# Patient Record
Sex: Male | Born: 1960 | Race: White | Hispanic: No
Health system: Southern US, Community
[De-identification: ages and names within clinical notes are randomized; demographics above are authoritative.]

## PROBLEM LIST (undated history)

## (undated) HISTORY — PX: INNER EAR SURGERY: SHX679

---

## 2014-11-21 ENCOUNTER — Emergency Department (HOSPITAL_BASED_OUTPATIENT_CLINIC_OR_DEPARTMENT_OTHER)
Admission: EM | Admit: 2014-11-21 | Discharge: 2014-11-21 | Disposition: A | Discharge status: Home / Self Care | Payer: Worker's Compensation | Attending: Emergency Medicine | Admitting: Emergency Medicine

## 2014-11-21 ENCOUNTER — Emergency Department (HOSPITAL_BASED_OUTPATIENT_CLINIC_OR_DEPARTMENT_OTHER): Payer: Worker's Compensation

## 2014-11-21 ENCOUNTER — Encounter (HOSPITAL_BASED_OUTPATIENT_CLINIC_OR_DEPARTMENT_OTHER): Payer: Self-pay

## 2014-11-21 DIAGNOSIS — Z720 Tobacco use: Secondary | ICD-10-CM | POA: Insufficient documentation

## 2014-11-21 DIAGNOSIS — Y9389 Activity, other specified: Secondary | ICD-10-CM | POA: Diagnosis not present

## 2014-11-21 DIAGNOSIS — S3992XA Unspecified injury of lower back, initial encounter: Secondary | ICD-10-CM | POA: Diagnosis present

## 2014-11-21 DIAGNOSIS — M545 Low back pain, unspecified: Secondary | ICD-10-CM

## 2014-11-21 DIAGNOSIS — Y990 Civilian activity done for income or pay: Secondary | ICD-10-CM | POA: Diagnosis not present

## 2014-11-21 DIAGNOSIS — X58XXXA Exposure to other specified factors, initial encounter: Secondary | ICD-10-CM | POA: Insufficient documentation

## 2014-11-21 DIAGNOSIS — Y9289 Other specified places as the place of occurrence of the external cause: Secondary | ICD-10-CM | POA: Insufficient documentation

## 2014-11-21 DIAGNOSIS — S32020A Wedge compression fracture of second lumbar vertebra, initial encounter for closed fracture: Secondary | ICD-10-CM | POA: Insufficient documentation

## 2014-11-21 DIAGNOSIS — S32000A Wedge compression fracture of unspecified lumbar vertebra, initial encounter for closed fracture: Secondary | ICD-10-CM

## 2014-11-21 MED ORDER — NAPROXEN 500 MG PO TABS: 500.0000 mg | ORAL_TABLET | Freq: Two times a day (BID) | ORAL | Status: AC

## 2014-11-21 MED ORDER — HYDROCODONE-ACETAMINOPHEN 5-325 MG PO TABS: 1.0000 | ORAL_TABLET | Freq: Four times a day (QID) | ORAL | Status: AC | PRN

## 2014-11-21 MED ORDER — CYCLOBENZAPRINE HCL 10 MG PO TABS: 10.0000 mg | ORAL_TABLET | Freq: Two times a day (BID) | ORAL | Status: AC | PRN

## 2014-11-21 NOTE — Discharge Instructions (Signed)
Work note provided to be out of work for a week. X-rays did show evidence of an upper lumbar compression fracture that may be responsible for the pain that you have in your left anterior thigh. It would not explain the lower back pain as well. Lower back pain as well. Take medications as directed. Take the Naprosyn on a regular basis. Rest as much possible for the next week. Make an appointment to follow-up with orthopedics. Workmen's Comp. paperwork completed.

## 2014-11-21 NOTE — ED Provider Notes (Signed)
CSN: [id number]     Arrival date & time 11/21/14  4098 History   First MD Initiated Contact with Patient 11/21/14 [phone]     Chief Complaint  Patient presents with  . Back Pain     (Consider location/radiation/quality/duration/timing/severity/associated sxs/prior Treatment) Patient is a 54 y.o. male presenting with back pain. The history is provided by the patient.  Back Pain Associated symptoms: no abdominal pain, no chest pain, no dysuria, no fever, no numbness and no weakness    the patient injured the back lifting a trailer on September 19. Originally pain was low right-sided back and radiating into the right groin. Now he has bilateral low back pain goes down into the buttocks no posterior leg pain. However does have discomfort to the front of the left leg. Pain is rated 6 out of 10. Currently now left side hurts more than right. No upper back pain. No numbness or weakness to the legs. No incontinence. Pain is worse with movement.  History reviewed. No pertinent past medical history. Past Surgical History  Procedure Laterality Date  . Inner ear surgery     No family history on file. Social History  Substance Use Topics  . Smoking status: Current Every Day Smoker -- 1.50 packs/day    Types: Cigarettes  . Smokeless tobacco: None  . Alcohol Use: Yes     Comment: occassionaly     Review of Systems  Constitutional: Negative for fever.  HENT: Negative for congestion.   Eyes: Negative for visual disturbance.  Respiratory: Negative for shortness of breath.   Cardiovascular: Negative for chest pain.  Gastrointestinal: Negative for nausea, vomiting and abdominal pain.  Genitourinary: Negative for dysuria and hematuria.  Musculoskeletal: Positive for back pain. Negative for neck pain.  Skin: Negative for rash.  Neurological: Negative for weakness and numbness.  Hematological: Does not bruise/bleed easily.  Psychiatric/Behavioral: Negative for confusion.      Allergies  Review  of patient's allergies indicates no known allergies.  Home Medications   Prior to Admission medications   Medication Sig Start Date End Date Taking? Authorizing Provider  cyclobenzaprine (FLEXERIL) 10 MG tablet Take 1 tablet (10 mg total) by mouth 2 (two) times daily as needed for muscle spasms. 11/21/14   Vanetta Mulders, MD  HYDROcodone-acetaminophen (NORCO/VICODIN) 5-325 MG tablet Take 1-2 tablets by mouth every 6 (six) hours as needed for moderate pain. 11/21/14   Vanetta Mulders, MD  naproxen (NAPROSYN) 500 MG tablet Take 1 tablet (500 mg total) by mouth 2 (two) times daily. 11/21/14   Vanetta Mulders, MD   BP 132/69 mmHg  Pulse 75  Temp(Src) 98.1 F (36.7 C) (Oral)  Resp 17  SpO2 100% Physical Exam  Constitutional: He is oriented to person, place, and time. He appears well-developed and well-nourished. No distress.  HENT:  Head: Normocephalic and atraumatic.  Mouth/Throat: Oropharynx is clear and moist.  Eyes: Conjunctivae and EOM are normal. Pupils are equal, round, and reactive to light.  Neck: Normal range of motion. Neck supple.  Cardiovascular: Normal rate, regular rhythm and normal heart sounds.   No murmur heard. Pulmonary/Chest: Effort normal and breath sounds normal. No respiratory distress.  Abdominal: Soft. Bowel sounds are normal. There is no tenderness.  Musculoskeletal: Normal range of motion.  Neurological: He is alert and oriented to person, place, and time. No cranial nerve deficit. He exhibits normal muscle tone. Coordination normal.  Skin: Skin is warm. No rash noted.  Nursing note and vitals reviewed.   ED Course  Procedures (  including critical care time) Labs Review Labs Reviewed - No data to display  Imaging Review Dg Lumbar Spine Complete  11/21/2014   CLINICAL DATA:  Lifting injury on November 11, 2014 with persistent severe low back pain without improvement.  EXAM: LUMBAR SPINE - COMPLETE 4+ VIEW  COMPARISON:  None in PACs  FINDINGS: There is  superior endplate depression of L2 with anterior cortical buckling. There is no retropulsion of bone. There is mild disc space narrowing at L1-2. The other vertebral body and disc space heights are well maintained. The pedicles and transverse processes are intact. There is no spondylolisthesis. The observed portions of the sacrum are normal.  IMPRESSION: Mild superior endplate compression of L2 with loss of height of 10%. There is adjacent mild disc space narrowing at L1-2. Otherwise the examination is unremarkable.   Electronically Signed   By: David  [place] M.D.   On: 11/21/2014 09:27   I have personally reviewed and evaluated these images and lab results as part of my medical decision-making.   EKG Interpretation None      MDM   Final diagnoses:  Bilateral low back pain without sciatica  Lumbar compression fracture, closed, initial encounter    Patient referred in by employer works for state of [employer]. Patient with one-week history of low back pain bilateral radiates into the buttocks. Also with pain in the left anterior thigh area. Patient strained something lifting a trailer on September 19. Originally pain went to right groin and right sided back but now it's just bilateral low back. And the pain mentioned in the right thigh.  X-ray show evidence of an L2 compression fracture this could be responsible for the irritation of the femoral nerve and the right thigh pain. It is unlikely that explains the bilateral low back pain because it's down in the sacral area.  Patient will be treated symptomatically and referral to orthopedics. Patient given a work note to be out of work until October 8. Patient will be treated with Naprosyn Flexeril and hydrocodone as needed.    Vanetta Mulders, MD 11/21/14 1014

## 2014-11-21 NOTE — ED Notes (Signed)
Pt states he was lifting a trailer at work and hurt his back.

## 2014-11-21 NOTE — ED Notes (Addendum)
Pt ambulated to room. States he hurt his back while at work on 9/19. States it started in groin and now it's lower back, was lifting a trailer. Has taken [facility] powders for pain. States he does not like codeine, but no allergy to it. Work told him he needed to come to ED to get recommended for chiropractor.

## 2015-03-14 ENCOUNTER — Other Ambulatory Visit: Payer: Self-pay | Admitting: Orthopedic Surgery

## 2015-03-14 DIAGNOSIS — M533 Sacrococcygeal disorders, not elsewhere classified: Secondary | ICD-10-CM

## 2015-03-17 ENCOUNTER — Other Ambulatory Visit: Payer: Self-pay | Admitting: Orthopedic Surgery

## 2015-03-17 DIAGNOSIS — M533 Sacrococcygeal disorders, not elsewhere classified: Secondary | ICD-10-CM

## 2015-03-26 ENCOUNTER — Ambulatory Visit
Admission: RE | Admit: 2015-03-26 | Discharge: 2015-03-26 | Disposition: A | Discharge status: Home / Self Care | Payer: Worker's Compensation | Source: Ambulatory Visit | Attending: Orthopedic Surgery | Admitting: Orthopedic Surgery

## 2015-03-26 DIAGNOSIS — M533 Sacrococcygeal disorders, not elsewhere classified: Secondary | ICD-10-CM

## 2016-09-19 IMAGING — CR DG LUMBAR SPINE COMPLETE 4+V
5 series · 5 of 5 positions shown · non-contrast
Comparison: None in PACs

CLINICAL DATA: Lifting injury on November 11, 2014 with persistent
severe low back pain without improvement.

EXAM:
LUMBAR SPINE - COMPLETE 4+ VIEW

[t l-spine a.p. *]
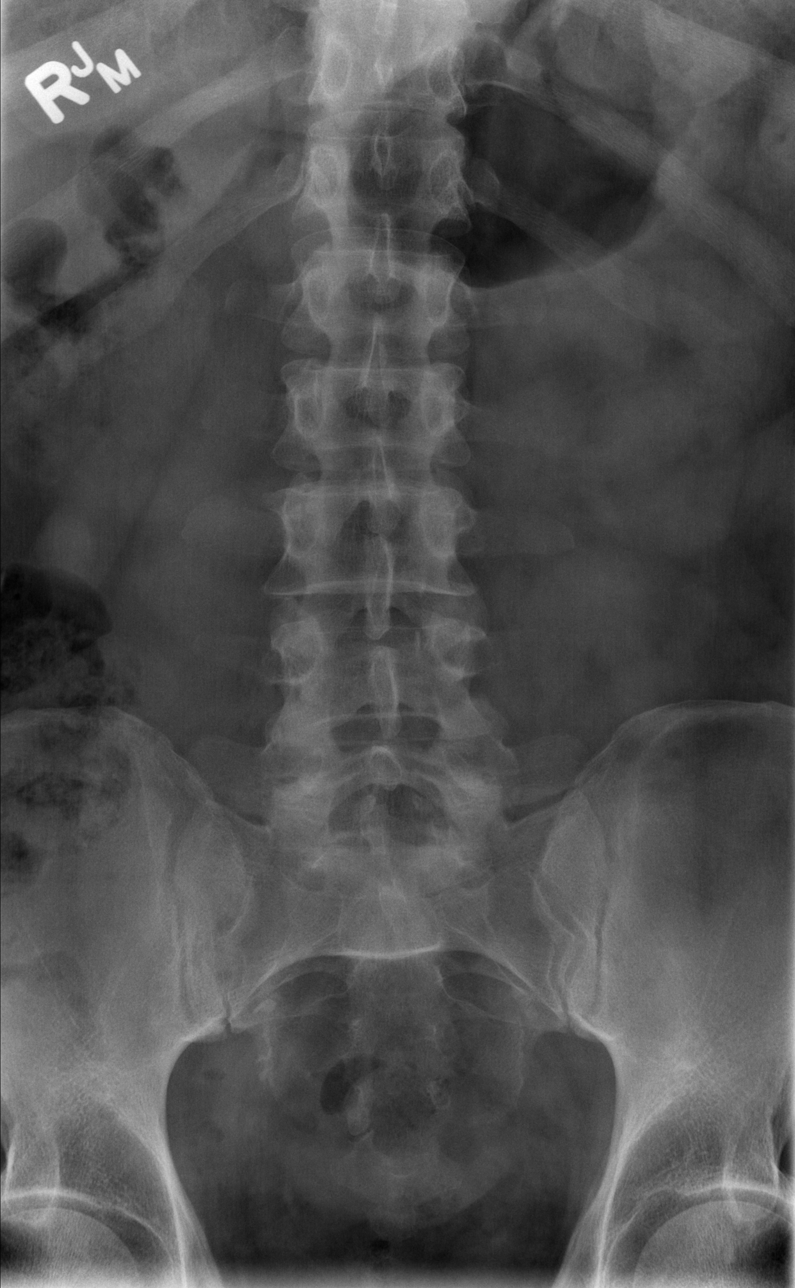

[t l-spine oblique exposure * (1 of 2)]
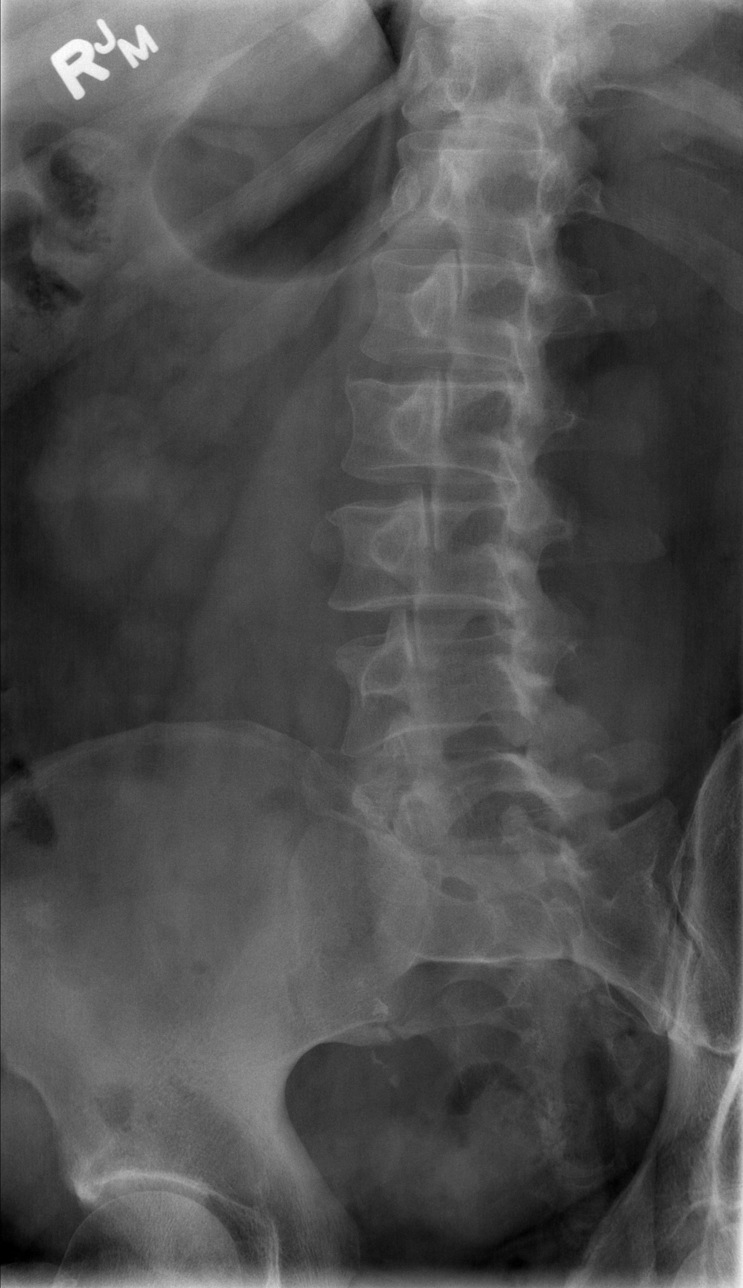

[t l-spine oblique exposure * (2 of 2)]
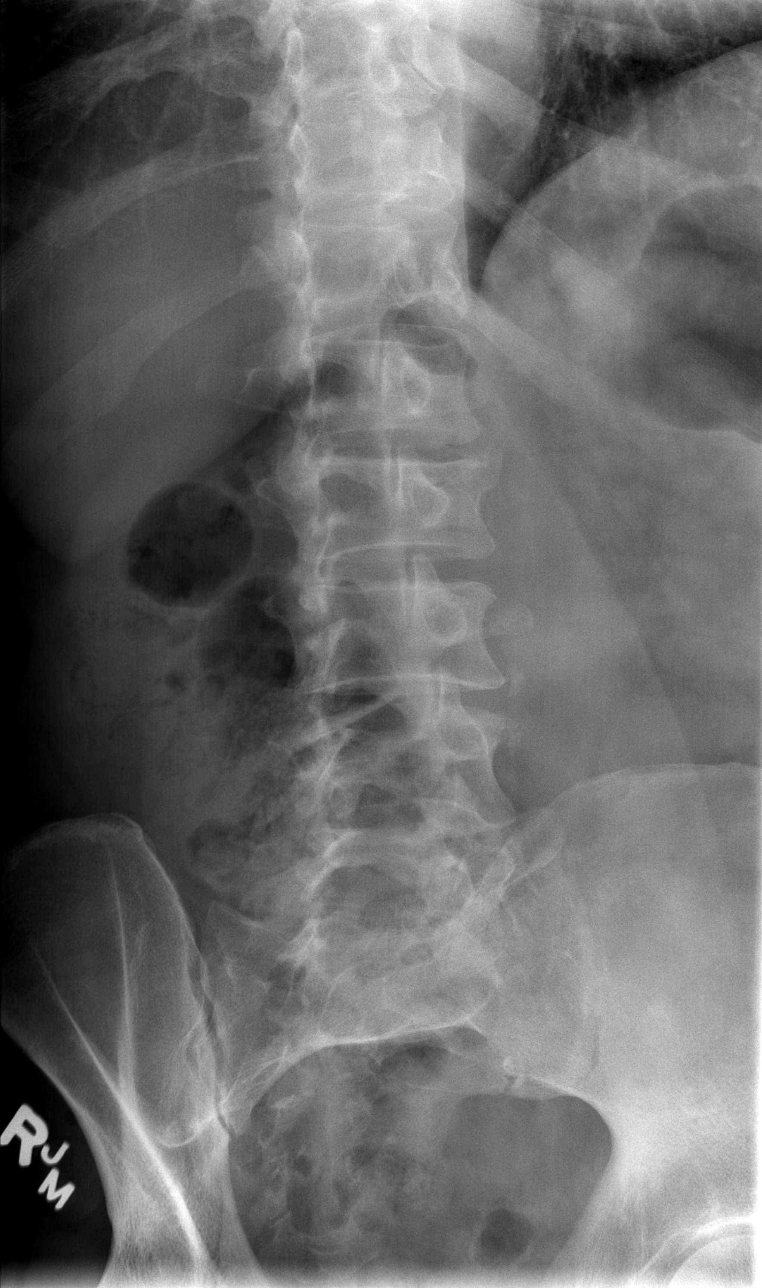

[t l-spine l5-s1 spot *]
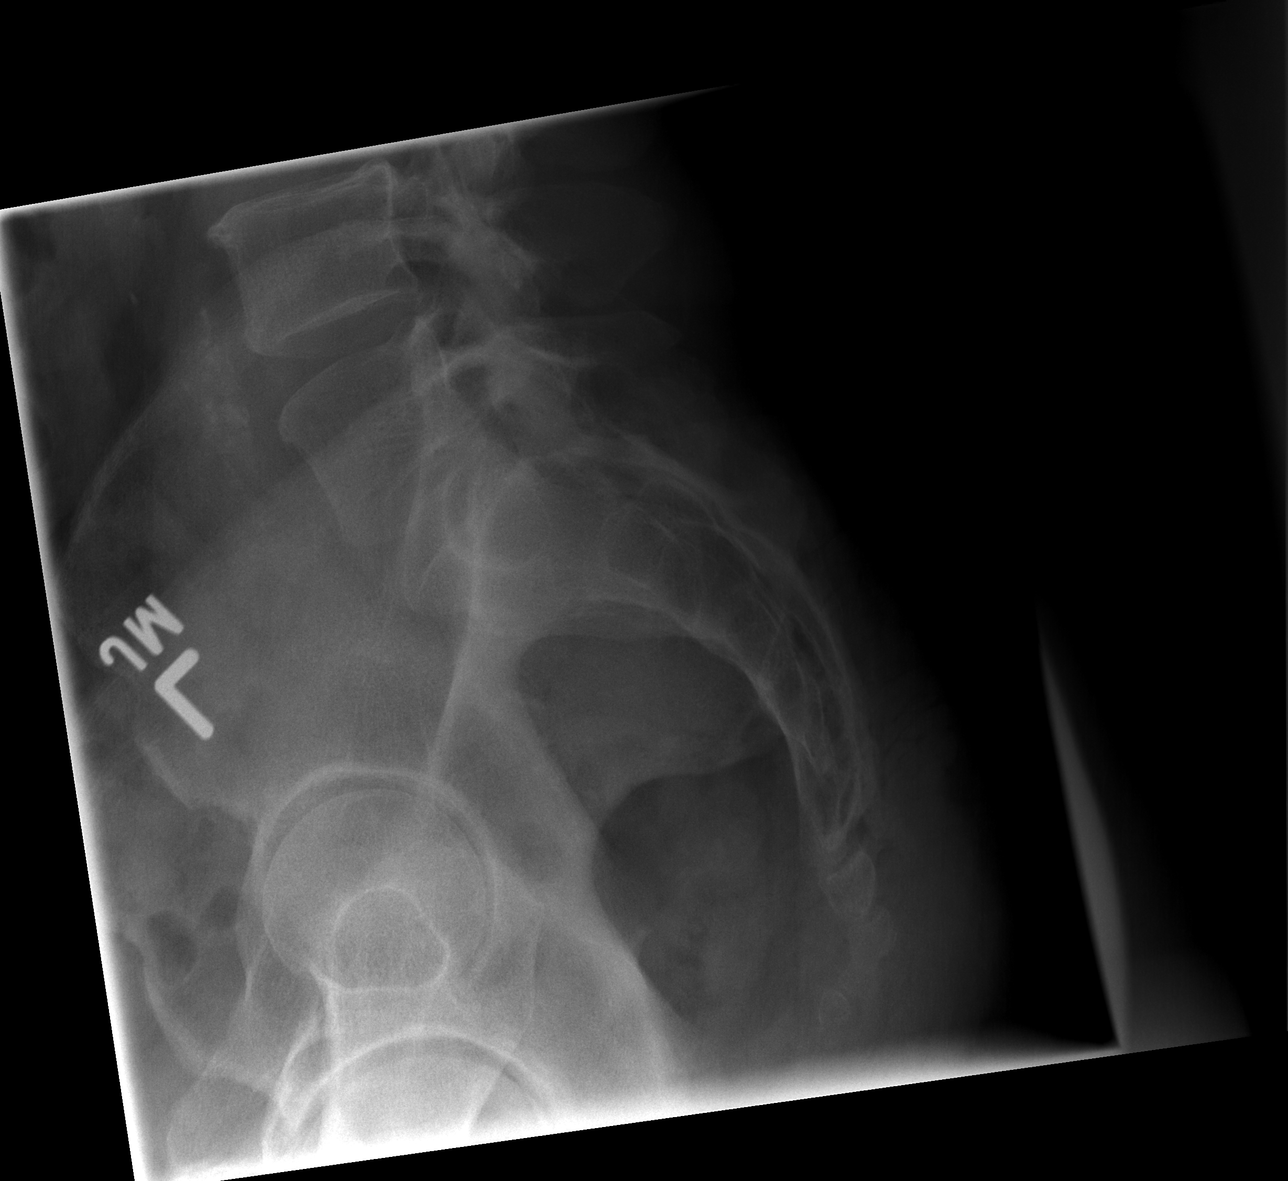

[t l-spine lat *]
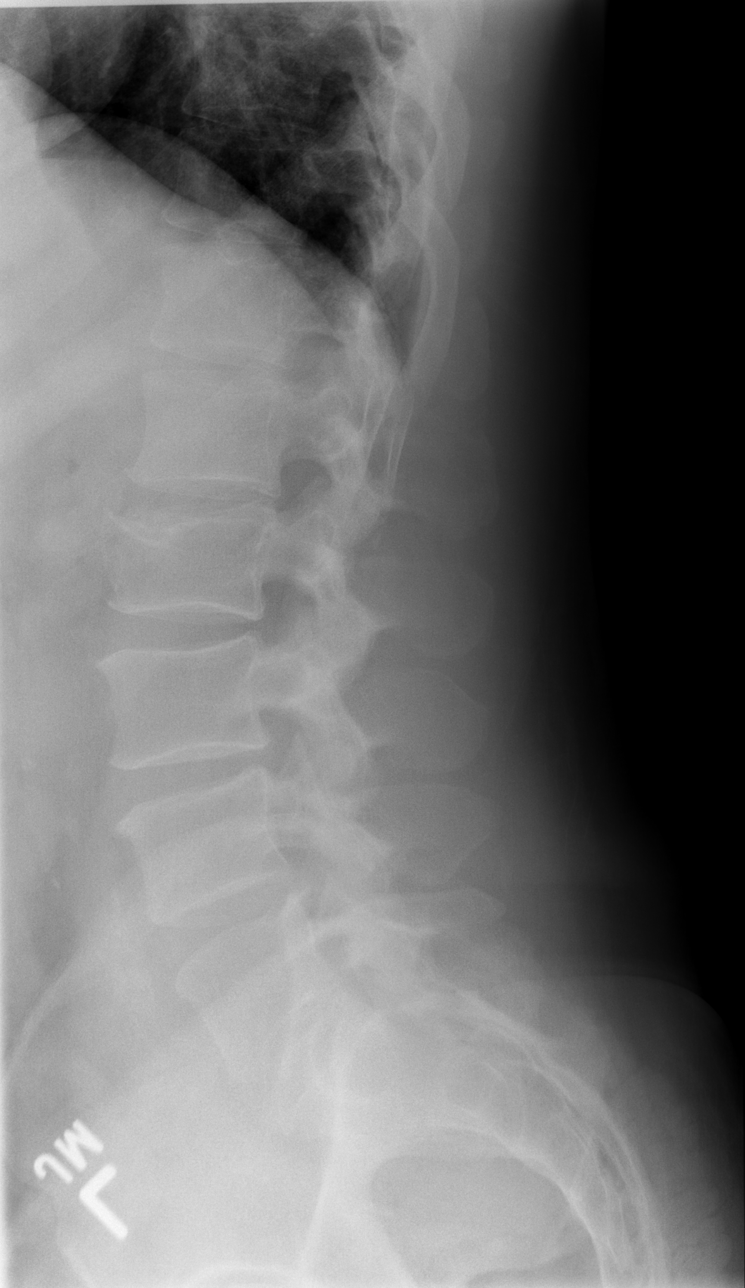

[5 of 5 positions shown; findings below may reference images not displayed]

FINDINGS: There is superior endplate depression of L2 with anterior cortical
buckling. There is no retropulsion of bone. There is mild disc space
narrowing at L1-2. The other vertebral body and disc space heights
are well maintained. The pedicles and transverse processes are
intact. There is no spondylolisthesis. The observed portions of the
sacrum are normal.
IMPRESSION: Mild superior endplate compression of L2 with loss of height of 10%.
There is adjacent mild disc space narrowing at L1-2. Otherwise the
examination is unremarkable.

## 2019-11-06 ENCOUNTER — Other Ambulatory Visit: Payer: Self-pay
